# Patient Record
Sex: Male | Born: 1980 | Race: Black or African American | Hispanic: No | Marital: Single | Smoking: Current every day smoker
Health system: Southern US, Community
[De-identification: ages and names within clinical notes are randomized; demographics above are authoritative.]

## PROBLEM LIST (undated history)

## (undated) DIAGNOSIS — E119 Type 2 diabetes mellitus without complications: Secondary | ICD-10-CM

## (undated) HISTORY — PX: WISDOM TOOTH EXTRACTION: SHX21

---

## 2013-05-11 ENCOUNTER — Other Ambulatory Visit: Payer: Self-pay | Admitting: Neurosurgery

## 2013-05-11 DIAGNOSIS — M79604 Pain in right leg: Secondary | ICD-10-CM

## 2013-05-20 ENCOUNTER — Other Ambulatory Visit: Payer: Self-pay

## 2013-06-03 ENCOUNTER — Other Ambulatory Visit: Payer: Self-pay

## 2013-07-05 ENCOUNTER — Ambulatory Visit
Admission: RE | Admit: 2013-07-05 | Discharge: 2013-07-05 | Disposition: A | Discharge status: Home / Self Care | Payer: BC Managed Care – PPO | Source: Ambulatory Visit | Attending: Neurosurgery | Admitting: Neurosurgery

## 2013-07-05 DIAGNOSIS — M79604 Pain in right leg: Secondary | ICD-10-CM

## 2013-07-18 ENCOUNTER — Other Ambulatory Visit: Payer: Self-pay | Admitting: Neurosurgery

## 2013-07-18 DIAGNOSIS — M549 Dorsalgia, unspecified: Secondary | ICD-10-CM

## 2013-07-19 ENCOUNTER — Ambulatory Visit
Admission: RE | Admit: 2013-07-19 | Discharge: 2013-07-19 | Disposition: A | Discharge status: Home / Self Care | Payer: BC Managed Care – PPO | Source: Ambulatory Visit | Attending: Neurosurgery | Admitting: Neurosurgery

## 2013-07-19 VITALS — BP 144/85 | HR 90

## 2013-07-19 DIAGNOSIS — M549 Dorsalgia, unspecified: Secondary | ICD-10-CM

## 2013-07-19 DIAGNOSIS — M5126 Other intervertebral disc displacement, lumbar region: Secondary | ICD-10-CM

## 2013-07-19 MED ORDER — METHYLPREDNISOLONE ACETATE 40 MG/ML INJ SUSP (RADIOLOG
120.0000 mg | Freq: Once | INTRAMUSCULAR | Status: AC
  Administered 2013-07-19: 120 mg via EPIDURAL

## 2013-07-19 MED ORDER — IOHEXOL 180 MG/ML  SOLN
1.0000 mL | Freq: Once | INTRAMUSCULAR | Status: AC | PRN
Start: 2013-07-19 — End: 2013-07-19
  Administered 2013-07-19: 1 mL via EPIDURAL

## 2013-07-19 NOTE — Discharge Instructions (Signed)

## 2017-11-13 ENCOUNTER — Other Ambulatory Visit: Payer: Self-pay

## 2017-11-13 ENCOUNTER — Emergency Department (HOSPITAL_COMMUNITY)
Admission: EM | Admit: 2017-11-13 | Discharge: 2017-11-13 | Disposition: A | Discharge status: Home / Self Care | Payer: Self-pay | Attending: Emergency Medicine | Admitting: Emergency Medicine

## 2017-11-13 ENCOUNTER — Encounter (HOSPITAL_COMMUNITY): Payer: Self-pay | Admitting: Emergency Medicine

## 2017-11-13 DIAGNOSIS — F17210 Nicotine dependence, cigarettes, uncomplicated: Secondary | ICD-10-CM | POA: Insufficient documentation

## 2017-11-13 DIAGNOSIS — L0201 Cutaneous abscess of face: Secondary | ICD-10-CM | POA: Insufficient documentation

## 2017-11-13 DIAGNOSIS — E119 Type 2 diabetes mellitus without complications: Secondary | ICD-10-CM | POA: Insufficient documentation

## 2017-11-13 HISTORY — DX: Type 2 diabetes mellitus without complications: E11.9

## 2017-11-13 MED ORDER — LIDOCAINE-EPINEPHRINE (PF) 2 %-1:200000 IJ SOLN
10.0000 mL | Freq: Once | INTRAMUSCULAR | Status: AC
  Administered 2017-11-13: 10 mL via INTRADERMAL
  Filled 2017-11-13: qty 20

## 2017-11-13 MED ORDER — SULFAMETHOXAZOLE-TRIMETHOPRIM 800-160 MG PO TABS: 1.0000 | ORAL_TABLET | Freq: Two times a day (BID) | ORAL | 0 refills | Status: AC

## 2017-11-13 MED ORDER — POVIDONE-IODINE 10 % EX SOLN
CUTANEOUS | Status: AC
  Filled 2017-11-13: qty 15

## 2017-11-13 NOTE — ED Triage Notes (Signed)
Pt c/o of right sided facial swelling x 2 weeks. Pt denies any dental issues or new injuries.

## 2017-11-13 NOTE — ED Notes (Signed)
Pt has had a rasied area to his R jaw for 3 weeks Was seen at the [facility] 2 weeks ago, given an injection and told to see his PCP Pt saw provider who "looked at it and said it looked like a hair ball"   Pt reports that for the last 2 days, the area has been painful   Area to R lower jaw area is raised, not hot to touch, but tender to patient upon palpation

## 2017-11-13 NOTE — ED Provider Notes (Signed)
[facility] EMERGENCY DEPARTMENT Provider Note   CSN: [id number] Arrival date & time: 11/13/17  1132     History   Chief Complaint Chief Complaint  Patient presents with  . Facial Swelling    HPI Michael Mcgrath is a 37 y.o. male.  HPI  Michael Mcgrath is a 37 y.o. male who presents to the Emergency Department complaining of pain and swelling to the right lower jawline.  Symptoms present for 2 weeks.  He notes increased swelling after trimming his beard.  He describes the area as itching, but painful to the touch.  He denies fever, neck pain, dental pain and sore throat.  No trouble swallowing or opening and closing his mouth.  No history of rash or tick bite.  He states he was seen at another ER 2 weeks ago at time of onset and given some type of injection which he states provided no relief.  He is also been seen by his PCP for this.   Past Medical History:  Diagnosis Date  . Diabetes mellitus without complication (HCC)     There are no active problems to display for this patient.   Past Surgical History:  Procedure Laterality Date  . WISDOM TOOTH EXTRACTION        Home Medications    Prior to Admission medications   Not on File    Family History History reviewed. No pertinent family history.  Social History Social History   Tobacco Use  . Smoking status: Current Every Day Smoker    Packs/day: 0.50    Years: 8.00    Pack years: 4.00    Types: Cigarettes  . Smokeless tobacco: Never Used  Substance Use Topics  . Alcohol use: Yes  . Drug use: Never     Allergies   Hydrocodone   Review of Systems Review of Systems  Constitutional: Negative for chills and fever.  HENT: Positive for facial swelling. Negative for congestion, dental problem, sore throat and trouble swallowing.        Local area of pain and swelling along the right jawline  Gastrointestinal: Negative for nausea and vomiting.  Musculoskeletal: Negative for arthralgias, joint  swelling, neck pain and neck stiffness.  Skin: Negative for color change.       Abscess   Neurological: Negative for dizziness and headaches.  Hematological: Negative for adenopathy.  All other systems reviewed and are negative.    Physical Exam Updated Vital Signs BP (!) 161/92 (BP Location: Right Arm)   Pulse 77   Temp 97.9 F (36.6 C) (Oral)   Resp 20   Ht 5\' 7"  (1.702 m)   Wt 107 kg (236 lb)   SpO2 99%   BMI 36.96 kg/m   Physical Exam  Constitutional: He is oriented to person, place, and time. He appears well-developed and well-nourished. No distress.  HENT:  Head: Atraumatic.  Mouth/Throat: Oropharynx is clear and moist.  Quarter sized area of fluctuance and induration along the right mid jawline.  No surrounding erythema.  Patient does have a closely trimmed beard.  Neck: Normal range of motion. Neck supple.  Cardiovascular: Normal rate.  Pulmonary/Chest: Effort normal and breath sounds normal. No respiratory distress.  Musculoskeletal: Normal range of motion.  Lymphadenopathy:    He has no cervical adenopathy.  Neurological: He is alert and oriented to person, place, and time.  Skin: Skin is warm. Capillary refill takes less than 2 seconds.  Nursing note and vitals reviewed.    ED Treatments / Results  Labs (all labs ordered are listed, but only abnormal results are displayed) Labs Reviewed - No data to display  EKG None  Radiology No results found.  Procedures .Marland Kitchen.Incision and Drainage Date/Time: 11/13/2017 12:50 PM Performed by: Pauline Ausriplett, Deb Loudin, PA-C Authorized by: Pauline Ausriplett, Tylie Golonka, PA-C   Consent:    Consent obtained:  Verbal   Consent given by:  Patient   Risks discussed:  Bleeding, incomplete drainage and infection   Alternatives discussed:  No treatment Location:    Type:  Abscess   Size:  4 cm   Location:  Head   Head location:  Face Pre-procedure details:    Skin preparation:  Betadine Sedation:    Sedation type: None. Anesthesia (see  MAR for exact dosages):    Anesthesia method:  Local infiltration   Local anesthetic:  Lidocaine 2% WITH epi Procedure type:    Complexity:  Complex Procedure details:    Needle aspiration: yes     Needle size:  18 G   Incision types:  Single straight   Incision depth:  Dermal   Scalpel blade:  11   Wound management:  Probed and deloculated, irrigated with saline and extensive cleaning   Drainage:  Bloody and purulent   Drainage amount:  Moderate   Wound treatment:  Wound left open   Packing materials:  None Post-procedure details:    Patient tolerance of procedure:  Tolerated well, no immediate complications   (including critical care time)  .  Medications Ordered in ED Medications  povidone-iodine (BETADINE) 10 % external solution (  Handoff 11/13/17 1231)  lidocaine-EPINEPHrine (XYLOCAINE W/EPI) 2 %-1:200000 (PF) injection 10 mL (10 mLs Intradermal Given by Other 11/13/17 1231)     Initial Impression / Assessment and Plan / ED Course  I have reviewed the triage vital signs and the nursing notes.  Pertinent labs & imaging results that were available during my care of the patient were reviewed by me and considered in my medical decision making (see chart for details).     Patient well-appearing.  Vitals reviewed.  Abscess to the right lower face with successful I&D.  No surrounding cellulitis.  Patient agrees to treatment plan with warm compresses, antibiotics, and close follow-up with PCP.  Return precautions discussed.  Final Clinical Impressions(s) / ED Diagnoses   Final diagnoses:  Abscess of face    ED Discharge Orders    None       Rosey Bathriplett, Jennifer Payes, PA-C 11/15/17 2105    Terrilee FilesButler, Michael C, MD 11/16/17 1200

## 2017-11-13 NOTE — ED Notes (Signed)
TT in to I&D

## 2017-11-13 NOTE — Discharge Instructions (Addendum)
Apply warm wet compresses on and off to your face 2-3 times a day until the area heals.  Take the antibiotics as directed until finished.  Return to the ER for any worsening symptoms.

## 2017-11-13 NOTE — ED Notes (Signed)
TT in to repair

## 2018-02-11 ENCOUNTER — Other Ambulatory Visit: Payer: Self-pay

## 2018-02-11 ENCOUNTER — Emergency Department (HOSPITAL_COMMUNITY): Payer: Self-pay

## 2018-02-11 ENCOUNTER — Emergency Department (HOSPITAL_COMMUNITY)
Admission: EM | Admit: 2018-02-11 | Discharge: 2018-02-12 | Disposition: A | Discharge status: Home / Self Care | Payer: Self-pay | Attending: Emergency Medicine | Admitting: Emergency Medicine

## 2018-02-11 ENCOUNTER — Encounter (HOSPITAL_COMMUNITY): Payer: Self-pay | Admitting: Emergency Medicine

## 2018-02-11 DIAGNOSIS — Z7984 Long term (current) use of oral hypoglycemic drugs: Secondary | ICD-10-CM | POA: Insufficient documentation

## 2018-02-11 DIAGNOSIS — E119 Type 2 diabetes mellitus without complications: Secondary | ICD-10-CM | POA: Insufficient documentation

## 2018-02-11 DIAGNOSIS — F8081 Childhood onset fluency disorder: Secondary | ICD-10-CM | POA: Insufficient documentation

## 2018-02-11 DIAGNOSIS — R51 Headache: Secondary | ICD-10-CM | POA: Insufficient documentation

## 2018-02-11 DIAGNOSIS — I10 Essential (primary) hypertension: Secondary | ICD-10-CM | POA: Insufficient documentation

## 2018-02-11 DIAGNOSIS — F17210 Nicotine dependence, cigarettes, uncomplicated: Secondary | ICD-10-CM | POA: Insufficient documentation

## 2018-02-11 DIAGNOSIS — R519 Headache, unspecified: Secondary | ICD-10-CM

## 2018-02-11 LAB — BASIC METABOLIC PANEL
Anion gap: 9 (ref 5–15)
BUN: 9 mg/dL (ref 6–20)
CO2: 27 mmol/L (ref 22–32)
Calcium: 9.8 mg/dL (ref 8.9–10.3)
Chloride: 101 mmol/L (ref 98–111)
Creatinine, Ser: 0.78 mg/dL (ref 0.61–1.24)
GFR calc Af Amer: >60 mL/min (ref >60)
GFR calc non Af Amer: >60 mL/min (ref >60)
Glucose, Bld: 212 mg/dL — ABNORMAL HIGH (ref 70–99)
Potassium: 3.5 mmol/L (ref 3.5–5.1)
Sodium: 137 mmol/L (ref 135–145)

## 2018-02-11 LAB — CBC WITH DIFFERENTIAL/PLATELET
Abs Immature Granulocytes: 0.03 10*3/uL (ref 0.00–0.07)
Basophils Absolute: 0 10*3/uL (ref 0.0–0.1)
Basophils Relative: 0 %
Eosinophils Absolute: 0.1 10*3/uL (ref 0.0–0.5)
Eosinophils Relative: 1 %
HCT: 49.1 % (ref 39.0–52.0)
Hemoglobin: 15.6 g/dL (ref 13.0–17.0)
Immature Granulocytes: 0 %
Lymphocytes Relative: 28 %
Lymphs Abs: 3.5 10*3/uL (ref 0.7–4.0)
MCH: 24.8 pg — ABNORMAL LOW (ref 26.0–34.0)
MCHC: 31.8 g/dL (ref 30.0–36.0)
MCV: 77.9 fL — ABNORMAL LOW (ref 80.0–100.0)
Monocytes Absolute: 0.7 10*3/uL (ref 0.1–1.0)
Monocytes Relative: 6 %
Neutro Abs: 8 10*3/uL — ABNORMAL HIGH (ref 1.7–7.7)
Neutrophils Relative %: 65 %
Platelets: 304 10*3/uL (ref 150–400)
RBC: 6.3 MIL/uL — ABNORMAL HIGH (ref 4.22–5.81)
RDW: 15.9 % — ABNORMAL HIGH (ref 11.5–15.5)
WBC: 12.3 10*3/uL — ABNORMAL HIGH (ref 4.0–10.5)
nRBC: 0 % (ref 0.0–0.2)

## 2018-02-11 LAB — URINALYSIS, ROUTINE W REFLEX MICROSCOPIC
Bilirubin Urine: NEGATIVE
Glucose, UA: 150 mg/dL — AB
Hgb urine dipstick: NEGATIVE
Ketones, ur: NEGATIVE mg/dL
Leukocytes, UA: NEGATIVE
Nitrite: NEGATIVE
Protein, ur: NEGATIVE mg/dL
Specific Gravity, Urine: 1.024 (ref 1.005–1.030)
pH: 5 (ref 5.0–8.0)

## 2018-02-11 LAB — CBG MONITORING, ED: GLUCOSE-CAPILLARY: 205 mg/dL — AB (ref 70–99)

## 2018-02-11 LAB — RAPID URINE DRUG SCREEN, HOSP PERFORMED
Amphetamines: NOT DETECTED
Barbiturates: NOT DETECTED
Benzodiazepines: NOT DETECTED
Cocaine: NOT DETECTED
Opiates: NOT DETECTED
Tetrahydrocannabinol: NOT DETECTED

## 2018-02-11 NOTE — ED Triage Notes (Signed)
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 Pt started having stuttering, shaking, carpal tunnel flare up and pain to rt side of head. 6/10 pain. Pt reports taking a friend's insulin today because he ran out of his. No injuries. A&Ox4, pt able to ambulate. Hx of diabetes. BP high in triage. CBG 205.

## 2018-02-11 NOTE — ED Notes (Signed)
Results reviewed, no changes in acuity at this time 

## 2018-02-11 NOTE — ED Provider Notes (Signed)
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 Pain to left arm, resolved now. Right temple painful, sharp and does not radiate.Felt palpitations. States he felt panicky.  Denies vision changes, vomiting, SOB. Checked his BS which was 200.  States that his only changes that he ran out of his insulin and used a friend's insulin today for the first time which he has never taken before.  States he has a history of hypertension but has not been on blood pressure medicines for several years because he did not need to be.  ROS: +headache, palpitations, stuttering  Physical Exam:   Gen: No distress  Neuro: Awake and Alert  Skin: Warm    Focused Exam: Heart regular rate and rhythm, no murmurs rubs or gallops.  Lungs clear to auscultation bilaterally.  He is stuttering and intermittently tremulous.  Ambulates with an ataxic gait.  Romberg sign absent.  No pronator drift.  Sensation intact to soft touch of face and extremities.  5/5 strength of BUE and BLE major muscle groups.  Cranial nerves II through XII tested and intact including peripheral fields.  Tenderness to palpation at the right TMJ.   Initiation of care has begun. The patient has been counseled on the process, plan, and necessity for staying for the completion/evaluation, and the remainder of the medical screening examination    Bennye Alm 02/11/18 2039    Dione Booze, MD 02/12/18 [phone]

## 2018-02-12 MED ORDER — DIPHENHYDRAMINE HCL 50 MG/ML IJ SOLN
25.0000 mg | Freq: Once | INTRAMUSCULAR | Status: AC
  Administered 2018-02-12: 25 mg via INTRAVENOUS
  Filled 2018-02-12: qty 1

## 2018-02-12 MED ORDER — METOCLOPRAMIDE HCL 5 MG/ML IJ SOLN
10.0000 mg | Freq: Once | INTRAMUSCULAR | Status: AC
  Administered 2018-02-12: 10 mg via INTRAVENOUS
  Filled 2018-02-12: qty 2

## 2018-02-12 MED ORDER — SODIUM CHLORIDE 0.9 % IV BOLUS
1000.0000 mL | Freq: Once | INTRAVENOUS | Status: AC
  Administered 2018-02-12: 1000 mL via INTRAVENOUS

## 2018-02-12 NOTE — Discharge Instructions (Addendum)
Call your doctor today to get a new prescription for your insulin.

## 2018-02-12 NOTE — ED Notes (Signed)
Pt discharged from ED; instructions provided; Pt encouraged to return to ED if symptoms worsen and to f/u with PCP; Pt verbalized understanding of all instructions 

## 2018-02-12 NOTE — ED Provider Notes (Signed)
[facility] EMERGENCY DEPARTMENT Provider Note   CSN: [id number] Arrival date & time: 02/11/18  2011     History   Chief Complaint Chief Complaint  Patient presents with  . Stuttering  . Feeling bad  . Headache    HPI Michael Mcgrath is a 37 y.o. male.  The history is provided by the patient.  He has history of diabetes and comes in because of episode of stuttering and feeling shaky.  He had apparently run out of his insulin and been borrowing some from a friend.  Blood sugar at home yesterday and today had been high.  At work, he developed a right temporal headache and this was followed by generalized shaking and stuttering.  He was off balance.  The stuttering and shaking lasted about 2 hours before resolving, headache has persisted.  Headache was originally 8/10, but has diminished to 6/10.  There has been nausea but no vomiting.  He denies any visual change.  He denies photophobia or phonophobia.  Past Medical History:  Diagnosis Date  . Diabetes mellitus without complication (HCC)     There are no active problems to display for this patient.   Past Surgical History:  Procedure Laterality Date  . WISDOM TOOTH EXTRACTION          Home Medications    Prior to Admission medications   Not on File    Family History No family history on file.  Social History Social History   Tobacco Use  . Smoking status: Current Every Day Smoker    Packs/day: 0.50    Years: 8.00    Pack years: 4.00    Types: Cigarettes  . Smokeless tobacco: Never Used  Substance Use Topics  . Alcohol use: Yes  . Drug use: Never     Allergies   Hydrocodone   Review of Systems Review of Systems  All other systems reviewed and are negative.    Physical Exam Updated Vital Signs BP (!) 158/99 (BP Location: Right Arm)   Pulse 88   Temp 98.6 F (37 C) (Oral)   Resp 14   Ht 5\' 6"  (1.676 m)   Wt 111.6 kg   SpO2 96%   BMI 39.71 kg/m   Physical Exam    Nursing note and vitals reviewed.  37 year old male, resting comfortably and in no acute distress. Vital signs are significant for elevated blood pressure. Oxygen saturation is 96%, which is normal. Head is normocephalic and atraumatic. PERRLA, EOMI. Oropharynx is clear.  There is mild tenderness to palpation over the right temporalis muscle.  There is no tenderness palpation at the insertion of paracervical muscles. Neck is nontender and supple without adenopathy or JVD. Back is nontender and there is no CVA tenderness. Lungs are clear without rales, wheezes, or rhonchi. Chest is nontender. Heart has regular rate and rhythm without murmur. Abdomen is soft, flat, nontender without masses or hepatosplenomegaly and peristalsis is normoactive. Extremities have no cyanosis or edema, full range of motion is present. Skin is warm and dry without rash. Neurologic: Mental status is normal, cranial nerves are intact, there are no motor or sensory deficits.  There is no pronator drift.  Speech is spontaneous and appropriate without evidence of stuttering.  ED Treatments / Results  Labs (all labs ordered are listed, but only abnormal results are displayed) Labs Reviewed  CBC WITH DIFFERENTIAL/PLATELET - Abnormal; Notable for the following components:      Result Value   WBC 12.3 (*)  RBC 6.30 (*)    MCV 77.9 (*)    MCH 24.8 (*)    RDW 15.9 (*)    Neutro Abs 8.0 (*)    All other components within normal limits  BASIC METABOLIC PANEL - Abnormal; Notable for the following components:   Glucose, Bld 212 (*)    All other components within normal limits  URINALYSIS, ROUTINE W REFLEX MICROSCOPIC - Abnormal; Notable for the following components:   Color, Urine AMBER (*)    APPearance CLOUDY (*)    Glucose, UA 150 (*)    All other components within normal limits  CBG MONITORING, ED - Abnormal; Notable for the following components:   Glucose-Capillary 205 (*)    All other components within normal  limits  RAPID URINE DRUG SCREEN, HOSP PERFORMED    EKG EKG Interpretation  Date/Time:  Friday February 11 2018 20:53:46 EDT Ventricular Rate:  85 PR Interval:  152 QRS Duration: 100 QT Interval:  364 QTC Calculation: 433 R Axis:   93 Text Interpretation:  Normal sinus rhythm Possible Right ventricular hypertrophy Abnormal ECG No old tracing to compare Confirmed by Dione Booze ([id number]) on 02/11/2018 11:05:42 PM   Radiology Ct Head Wo Contrast  Result Date: 02/11/2018 CLINICAL DATA:  Sudden onset of speech disturbance. EXAM: CT HEAD WITHOUT CONTRAST TECHNIQUE: Contiguous axial images were obtained from the base of the skull through the vertex without intravenous contrast. COMPARISON:  None. FINDINGS: Brain: The brain shows a normal appearance without evidence of malformation, atrophy, old or acute small or large vessel infarction, mass lesion, hemorrhage, hydrocephalus or extra-axial collection. Vascular: No hyperdense vessel. No evidence of atherosclerotic calcification. Skull: Normal.  No traumatic finding.  No focal bone lesion. Sinuses/Orbits: Sinuses are clear. Orbits appear normal. Mastoids are clear. Other: None significant IMPRESSION: Normal head CT Electronically Signed   By: Paulina Fusi M.D.   On: 02/11/2018 21:37    Procedures Procedures  Medications Ordered in ED Medications  sodium chloride 0.9 % bolus 1,000 mL (1,000 mLs Intravenous New Bag/Given 02/12/18 0138)  metoCLOPramide (REGLAN) injection 10 mg (10 mg Intravenous Given 02/12/18 0139)  diphenhydrAMINE (BENADRYL) injection 25 mg (25 mg Intravenous Given 02/12/18 0139)     Initial Impression / Assessment and Plan / ED Course  I have reviewed the triage vital signs and the nursing notes.  Pertinent labs & imaging results that were available during my care of the patient were reviewed by me and considered in my medical decision making (see chart for details).  Episode of stuttering and body shaking of uncertain  cause.  Symptoms occurred following headache, suspect possible migraine variant.  In spite of history, glucose was only mildly elevated in the emergency department.  Remainder of screening labs were normal.  Urinalysis was significant for presence of glucose, drug screen negative.  CT of head showed no acute process.  Old records are reviewed, and he has no relevant past visits.  He will be given a migraine cocktail of normal saline, metoclopramide, diphenhydramine and he will be reassessed at that point.  He had excellent relief of his headache from above-noted treatment.  At this point, I feel that his symptoms were all secondary to his headache which appears to be a migraine variant.  I have talked with him about his insulin, but he is not certain what type of insulin he is on, so I do not feel comfortable writing a prescription for it.  He is to contact his endocrinologist today to get new prescription  for his insulin.  Final Clinical Impressions(s) / ED Diagnoses   Final diagnoses:  Bad headache  Stuttering    ED Discharge Orders    None       Dione Booze, MD 02/12/18 [phone]

## 2018-02-14 ENCOUNTER — Emergency Department (HOSPITAL_COMMUNITY): Payer: Self-pay

## 2018-02-14 ENCOUNTER — Encounter (HOSPITAL_COMMUNITY): Payer: Self-pay | Admitting: *Deleted

## 2018-02-14 ENCOUNTER — Other Ambulatory Visit: Payer: Self-pay

## 2018-02-14 ENCOUNTER — Emergency Department (HOSPITAL_COMMUNITY)
Admission: EM | Admit: 2018-02-14 | Discharge: 2018-02-14 | Disposition: A | Discharge status: Home / Self Care | Payer: Self-pay | Attending: Emergency Medicine | Admitting: Emergency Medicine

## 2018-02-14 DIAGNOSIS — Y92410 Unspecified street and highway as the place of occurrence of the external cause: Secondary | ICD-10-CM | POA: Insufficient documentation

## 2018-02-14 DIAGNOSIS — Y9389 Activity, other specified: Secondary | ICD-10-CM | POA: Insufficient documentation

## 2018-02-14 DIAGNOSIS — F17210 Nicotine dependence, cigarettes, uncomplicated: Secondary | ICD-10-CM | POA: Insufficient documentation

## 2018-02-14 DIAGNOSIS — Z794 Long term (current) use of insulin: Secondary | ICD-10-CM | POA: Insufficient documentation

## 2018-02-14 DIAGNOSIS — R51 Headache: Secondary | ICD-10-CM | POA: Insufficient documentation

## 2018-02-14 DIAGNOSIS — E119 Type 2 diabetes mellitus without complications: Secondary | ICD-10-CM | POA: Insufficient documentation

## 2018-02-14 DIAGNOSIS — Y999 Unspecified external cause status: Secondary | ICD-10-CM | POA: Insufficient documentation

## 2018-02-14 DIAGNOSIS — S46812A Strain of other muscles, fascia and tendons at shoulder and upper arm level, left arm, initial encounter: Secondary | ICD-10-CM | POA: Insufficient documentation

## 2018-02-14 DIAGNOSIS — S8002XA Contusion of left knee, initial encounter: Secondary | ICD-10-CM | POA: Insufficient documentation

## 2018-02-14 MED ORDER — NAPROXEN 500 MG PO TABS: ORAL_TABLET | ORAL | 0 refills | Status: DC

## 2018-02-14 MED ORDER — NAPROXEN 250 MG PO TABS
500.0000 mg | ORAL_TABLET | Freq: Once | ORAL | Status: AC
  Administered 2018-02-14: 500 mg via ORAL
  Filled 2018-02-14: qty 2

## 2018-02-14 MED ORDER — CYCLOBENZAPRINE HCL 10 MG PO TABS
5.0000 mg | ORAL_TABLET | Freq: Once | ORAL | Status: AC
  Administered 2018-02-14: 5 mg via ORAL
  Filled 2018-02-14: qty 1

## 2018-02-14 MED ORDER — METHOCARBAMOL 500 MG PO TABS: ORAL_TABLET | ORAL | 0 refills | Status: DC
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
## 2018-02-14 NOTE — Discharge Instructions (Addendum)
Ice packs to the injured or sore muscles for the next several days then start using heat. Take the medications for pain and muscle spasms. Return to the ED for any problems listed on the head injury sheet. Recheck if you aren't improving in the next week.  You can have your knee rechecked by an orthopedist of your choice or you can call Dr. Mort Sawyers office for a recheck appointment.  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

## 2018-02-14 NOTE — ED Notes (Signed)
Patient returned to Xray.

## 2018-02-14 NOTE — ED Notes (Signed)
ED Provider at bedside. 

## 2019-08-21 ENCOUNTER — Telehealth: Payer: Self-pay

## 2019-08-21 ENCOUNTER — Other Ambulatory Visit: Payer: Self-pay

## 2019-08-21 ENCOUNTER — Other Ambulatory Visit: Payer: Self-pay | Admitting: Physician Assistant

## 2019-08-21 ENCOUNTER — Ambulatory Visit: Payer: Self-pay | Admitting: Physician Assistant

## 2019-08-21 VITALS — BP 141/89 | HR 80 | Temp 98.7°F | Resp 18 | Ht 67.0 in | Wt 242.0 lb

## 2019-08-21 DIAGNOSIS — F17200 Nicotine dependence, unspecified, uncomplicated: Secondary | ICD-10-CM

## 2019-08-21 DIAGNOSIS — R030 Elevated blood-pressure reading, without diagnosis of hypertension: Secondary | ICD-10-CM

## 2019-08-21 DIAGNOSIS — M25512 Pain in left shoulder: Secondary | ICD-10-CM

## 2019-08-21 DIAGNOSIS — Z598 Other problems related to housing and economic circumstances: Secondary | ICD-10-CM

## 2019-08-21 DIAGNOSIS — Z13220 Encounter for screening for lipoid disorders: Secondary | ICD-10-CM

## 2019-08-21 DIAGNOSIS — R739 Hyperglycemia, unspecified: Secondary | ICD-10-CM

## 2019-08-21 DIAGNOSIS — Z5989 Other problems related to housing and economic circumstances: Secondary | ICD-10-CM

## 2019-08-21 DIAGNOSIS — E1165 Type 2 diabetes mellitus with hyperglycemia: Secondary | ICD-10-CM

## 2019-08-21 DIAGNOSIS — M5430 Sciatica, unspecified side: Secondary | ICD-10-CM | POA: Insufficient documentation

## 2019-08-21 LAB — POCT GLYCOSYLATED HEMOGLOBIN (HGB A1C): Hemoglobin A1C: 10.7 % — AB (ref 4.0–5.6)

## 2019-08-21 LAB — GLUCOSE, POCT (MANUAL RESULT ENTRY): POC Glucose: 292 mg/dl — AB (ref 70–99)

## 2019-08-21 MED ORDER — ACCU-CHEK AVIVA PLUS VI STRP: ORAL_STRIP | 12 refills | Status: DC

## 2019-08-21 MED ORDER — IBUPROFEN 600 MG PO TABS: 600.0000 mg | ORAL_TABLET | Freq: Three times a day (TID) | ORAL | 0 refills | Status: AC | PRN

## 2019-08-21 MED ORDER — CYCLOBENZAPRINE HCL 10 MG PO TABS: 10.0000 mg | ORAL_TABLET | Freq: Three times a day (TID) | ORAL | 0 refills | Status: AC | PRN

## 2019-08-21 MED ORDER — TRUEPLUS 5-BEVEL PEN NEEDLES 32G X 4 MM MISC: 11 refills | Status: AC

## 2019-08-21 MED ORDER — TRUE METRIX METER DEVI: 1.0000 [IU] | Freq: Four times a day (QID) | 0 refills | Status: AC

## 2019-08-21 MED ORDER — "INSULIN SYRINGE 30G X 1/2"" 0.5 ML MISC": 1.0000 | Freq: Three times a day (TID) | 2 refills | Status: AC

## 2019-08-21 MED ORDER — INSULIN ASPART 100 UNIT/ML ~~LOC~~ SOLN: 10.0000 [IU] | Freq: Three times a day (TID) | SUBCUTANEOUS | 2 refills | Status: AC

## 2019-08-21 MED ORDER — LISINOPRIL 5 MG PO TABS: 5.0000 mg | ORAL_TABLET | Freq: Every day | ORAL | 3 refills | Status: AC

## 2019-08-21 MED ORDER — INSULIN DETEMIR 100 UNIT/ML ~~LOC~~ SOLN: 10.0000 [IU] | Freq: Every day | SUBCUTANEOUS | 2 refills | Status: AC

## 2019-08-21 MED ORDER — INSULIN LISPRO (1 UNIT DIAL) 100 UNIT/ML (KWIKPEN): 10.0000 [IU] | PEN_INJECTOR | Freq: Three times a day (TID) | SUBCUTANEOUS | 2 refills | Status: AC

## 2019-08-21 MED ORDER — TRUE METRIX PRO BLOOD GLUCOSE VI STRP: ORAL_STRIP | 12 refills | Status: AC

## 2019-08-21 MED ORDER — INSULIN ASPART 100 UNIT/ML ~~LOC~~ SOLN
8.0000 [IU] | Freq: Once | SUBCUTANEOUS | Status: AC
  Administered 2019-08-21: 8 [IU] via SUBCUTANEOUS

## 2019-08-21 MED ORDER — ACCU-CHEK SOFTCLIX LANCETS MISC: 12 refills | Status: DC

## 2019-08-21 MED ORDER — ACCU-CHEK SOFTCLIX LANCETS MISC: 12 refills | Status: AC

## 2019-08-21 MED FILL — TRUEplus LANCETS 28G MISC: 25 days supply | Qty: 100 | Fill #0

## 2019-08-21 MED FILL — TRUE METRIX TEST STRIP: 25 days supply | Qty: 100 | Fill #0

## 2019-08-21 MED FILL — IBUPROFEN 600 MG TABLET: 600 | 10 days supply | Qty: 30 | Fill #0

## 2019-08-21 MED FILL — !LEVEMIR FLEXTOUCH 100 UNIT: 100 | 30 days supply | Qty: 3 | Fill #0

## 2019-08-21 MED FILL — LISINOPRIL 5 MG TABLET: 5 | 30 days supply | Qty: 30 | Fill #0

## 2019-08-21 MED FILL — CYCLOBENZAPRINE 10 MG TAB: 10 | 10 days supply | Qty: 30 | Fill #0

## 2019-08-21 MED FILL — !TRUE METRIX BLOOD GLUCOSE: 365 days supply | Qty: 1 | Fill #0

## 2019-08-21 MED FILL — TRUEPLUS PEN NDL 32GX5/32: 32G X 4 MM | 25 days supply | Qty: 100 | Fill #0

## 2019-08-21 MED FILL — !HUMALOG 100 UNITS/ML KWIKP: 100 | 30 days supply | Qty: 9 | Fill #0

## 2019-08-21 NOTE — Progress Notes (Signed)
New Patient Office Visit  Subjective:  Patient ID: Michael Mcgrath, male    DOB: 12/18/1980  Age: 39 y.o. MRN: [id number]  CC:  Chief Complaint  Patient presents with  . Shoulder Pain    left    HPI Michael Mcgrath presents for medication refills, and left shoulder pain.  Reports that he has been out of his diabetic medication for the past 2 months.  Reports that he was last seen by his primary care provider in [place] in October.  Reports that he is going to start working in [place] tomorrow, will be living in this area.  Has not been checking his blood sugar, has not been following a diabetic diet.  States that he has previously failed Lantus and Metformin, states that he did not like the way he felt when he was taking the medication.  Reports that he was given samples of Xultophy, but is currently uninsured and unable to afford this medication.  Reports that he has had increased urination.  Reports that he started having left shoulder pain Thursday morning, denies injury trauma, denies numbness or tingling, denies radiation.  Has tried ibuprofen, icy hot, heating pad without much relief.       Past Medical History:  Diagnosis Date  . Diabetes mellitus without complication [facility])     Past Surgical History:  Procedure Laterality Date  . WISDOM TOOTH EXTRACTION      History reviewed. No pertinent family history.  Social History   Socioeconomic History  . Marital status: Single    Spouse name: Not on file  . Number of children: Not on file  . Years of education: Not on file  . Highest education level: Not on file  Occupational History  . Not on file  Tobacco Use  . Smoking status: Current Every Day Smoker    Packs/day: 0.50    Years: 8.00    Pack years: 4.00    Types: Cigarettes  . Smokeless tobacco: Never Used  Substance and Sexual Activity  . Alcohol use: Yes  . Drug use: Never  . Sexual activity: Yes  Other Topics Concern  . Not on file  Social  History Narrative  . Not on file   Social Determinants of Health   Financial Resource Strain:   . Difficulty of Paying Living Expenses:   Food Insecurity:   . Worried About Charity fundraiser in the Last Year:   . Arboriculturist in the Last Year:   Transportation Needs:   . Film/video editor (Medical):   Marland Kitchen Lack of Transportation (Non-Medical):   Physical Activity:   . Days of Exercise per Week:   . Minutes of Exercise per Session:   Stress:   . Feeling of Stress :   Social Connections:   . Frequency of Communication with Friends and Family:   . Frequency of Social Gatherings with Friends and Family:   . Attends Religious Services:   . Active Member of Clubs or Organizations:   . Attends Archivist Meetings:   Marland Kitchen Marital Status:   Intimate Partner Violence:   . Fear of Current or Ex-Partner:   . Emotionally Abused:   Marland Kitchen Physically Abused:   . Sexually Abused:     ROS Review of Systems  Constitutional: Negative.   HENT: Negative.   Eyes: Negative.  Negative for visual disturbance.  Respiratory: Negative.   Cardiovascular: Negative.   Gastrointestinal: Negative.   Endocrine: Positive for polyuria. Negative for polydipsia.  Musculoskeletal: Positive for arthralgias and myalgias.  Skin: Negative.   Allergic/Immunologic: Negative.   Neurological: Negative for dizziness, light-headedness and numbness.  Hematological: Negative.   Psychiatric/Behavioral: Negative.     Objective:   Today's Vitals: BP (!) 141/89 (BP Location: Left Arm, Patient Position: Sitting, Cuff Size: Large)   Pulse 80   Temp 98.7 F (37.1 C) (Oral)   Resp 18   Ht 5\' 7"  (1.702 m)   Wt 242 lb (109.8 kg)   SpO2 97%   BMI 37.90 kg/m   Physical Exam Vitals and nursing note reviewed.  Constitutional:      General: He is not in acute distress.    Appearance: Normal appearance. He is obese. He is not ill-appearing.  HENT:     Head: Normocephalic and atraumatic.     Right Ear:  External ear normal.     Left Ear: External ear normal.     Nose: Nose normal.     Mouth/Throat:     Mouth: Mucous membranes are moist.     Pharynx: Oropharynx is clear.  Eyes:     Extraocular Movements: Extraocular movements intact.     Conjunctiva/sclera: Conjunctivae normal.     Pupils: Pupils are equal, round, and reactive to light.  Cardiovascular:     Rate and Rhythm: Normal rate and regular rhythm.     Pulses: Normal pulses.     Heart sounds: Normal heart sounds.  Pulmonary:     Effort: Pulmonary effort is normal.     Breath sounds: Normal breath sounds.  Abdominal:     General: Abdomen is flat. Bowel sounds are normal.     Palpations: Abdomen is soft.  Musculoskeletal:     Right shoulder: Normal.     Left shoulder: Tenderness present. No swelling or crepitus. Decreased range of motion. Normal strength.     Cervical back: Normal range of motion and neck supple.  Skin:    General: Skin is warm and dry.  Neurological:     General: No focal deficit present.     Mental Status: He is alert and oriented to person, place, and time.  Psychiatric:        Mood and Affect: Mood normal.        Behavior: Behavior normal.        Thought Content: Thought content normal.        Judgment: Judgment normal.     Assessment & Plan:   Problem List Items Addressed This Visit    None    Visit Diagnoses    Uncontrolled type 2 diabetes mellitus with hyperglycemia (HCC)    -  Primary   Relevant Medications   insulin aspart (novoLOG) injection 8 Units (Completed)   glucose blood (ACCU-CHEK AVIVA PLUS) test strip   Accu-Chek Softclix Lancets lancets   lisinopril (ZESTRIL) 5 MG tablet   insulin detemir (LEVEMIR) 100 UNIT/ML injection   insulin aspart (NOVOLOG) 100 UNIT/ML injection   Insulin Syringe-Needle U-100 (INSULIN SYRINGE .5CC/30GX1/2") 30G X 1/2" 0.5 ML MISC   Other Relevant Orders   CBC with Differential/Platelet   Comp. Metabolic Panel (12)   Lipid panel   Microalbumin /  creatinine urine ratio   TSH   Vitamin D, 25-hydroxy   Glucose (CBG) (Completed)   HgB A1c (Completed)   Hyperglycemia       Relevant Medications   insulin aspart (novoLOG) injection 8 Units (Completed)   Acute pain of left shoulder       Relevant Medications  cyclobenzaprine (FLEXERIL) 10 MG tablet   ibuprofen (ADVIL) 600 MG tablet   Uninsured       Lipid screening       Relevant Orders   Lipid panel   Elevated blood pressure reading in office without diagnosis of hypertension       Morbid obesity (HCC)       Relevant Medications   insulin aspart (novoLOG) injection 8 Units (Completed)   insulin detemir (LEVEMIR) 100 UNIT/ML injection   insulin aspart (NOVOLOG) 100 UNIT/ML injection   Tobacco use disorder          Outpatient Encounter Medications as of 08/21/2019  Medication Sig  . Accu-Chek Softclix Lancets lancets Use as instructed  . cyclobenzaprine (FLEXERIL) 10 MG tablet Take 1 tablet (10 mg total) by mouth 3 (three) times daily as needed for muscle spasms.  Marland Kitchen glucose blood (ACCU-CHEK AVIVA PLUS) test strip Use as instructed  . ibuprofen (ADVIL) 600 MG tablet Take 1 tablet (600 mg total) by mouth every 8 (eight) hours as needed.  . insulin aspart (NOVOLOG) 100 UNIT/ML injection Inject 10 Units into the skin 3 (three) times daily before meals.  . insulin detemir (LEVEMIR) 100 UNIT/ML injection Inject 0.1 mLs (10 Units total) into the skin at bedtime.  . Insulin Syringe-Needle U-100 (INSULIN SYRINGE .5CC/30GX1/2") 30G X 1/2" 0.5 ML MISC 1 Syringe by Does not apply route in the morning, at noon, and at bedtime.  Marland Kitchen lisinopril (ZESTRIL) 5 MG tablet Take 1 tablet (5 mg total) by mouth daily.  . [DISCONTINUED] Insulin Glargine-Lixisenatide (SOLIQUA ) Inject 30 Units into the skin daily.  . [DISCONTINUED] metFORMIN (GLUCOPHAGE-XR) 500 MG 24 hr tablet Take 1,000 mg by mouth 2 (two) times daily.  . [DISCONTINUED] methocarbamol (ROBAXIN) 500 MG tablet Take 1 or 2 po Q 6hrs for  painful muscles (Patient not taking: Reported on 08/21/2019)  . [DISCONTINUED] naproxen (NAPROSYN) 500 MG tablet Take 1 po BID with food prn pain (Patient not taking: Reported on 08/21/2019)  . [EXPIRED] insulin aspart (novoLOG) injection 8 Units    No facility-administered encounter medications on file as of 08/21/2019.   PLAN:  1. Uncontrolled type 2 diabetes mellitus with hyperglycemia (HCC)  Patient is unable to tolerate Metformin or Lantus, is currently uninsured, after discussion of medication treatment regimens, patient would prefer to use sliding scale insulin 3 times a day prior to meals and Levemir at bedtime.  We will also start patient on lisinopril 5 mg, fasting labs completed today.  Patient is currently uninsured, was given information on [facility] financial assistance, [facility].   Issues reviewed with him: home glucose monitoring emphasized, all medications, side effects and compliance discussed carefully, use and side effects of insulin is taught, foot care discussed and Podiatry visits discussed, annual eye examinations at Ophthalmology discussed and long term diabetic complications discussed.  - CBC with Differential/Platelet - Comp. Metabolic Panel (12) - Lipid panel - Microalbumin / creatinine urine ratio - TSH - Vitamin D, 25-hydroxy - glucose blood (ACCU-CHEK AVIVA PLUS) test strip; Use as instructed  Dispense: 100 each; Refill: 12 - Accu-Chek Softclix Lancets lancets; Use as instructed  Dispense: 100 each; Refill: 12 - lisinopril (ZESTRIL) 5 MG tablet; Take 1 tablet (5 mg total) by mouth daily.  Dispense: 90 tablet; Refill: 3 - insulin detemir (LEVEMIR) 100 UNIT/ML injection; Inject 0.1 mLs (10 Units total) into the skin at bedtime.  Dispense: 3 mL;  Refill: 2 - insulin aspart (NOVOLOG) 100 UNIT/ML injection; Inject 10 Units into the skin 3 (three) times daily before meals.   Dispense: 9 mL; Refill: 2 - Insulin Syringe-Needle U-100 (INSULIN SYRINGE .5CC/30GX1/2") 30G X 1/2" 0.5 ML MISC; 1 Syringe by Does not apply route in the morning, at noon, and at bedtime.  Dispense: 90 each; Refill: 2 - Glucose (CBG) - HgB A1c  2. Hyperglycemia  - insulin aspart (novoLOG) injection 8 Units  3. Acute pain of left shoulder Patient education given on rest, ice, ibuprofen, over-the-counter pain relief cream, gentle stretching as tolerated - cyclobenzaprine (FLEXERIL) 10 MG tablet; Take 1 tablet (10 mg total) by mouth 3 (three) times daily as needed for muscle spasms.  Dispense: 30 tablet; Refill: 0 - ibuprofen (ADVIL) 600 MG tablet; Take 1 tablet (600 mg total) by mouth every 8 (eight) hours as needed.  Dispense: 30 tablet; Refill: 0  4. Uninsured   5. Lipid screening  - Lipid panel  6. Elevated blood pressure reading in office without diagnosis of hypertension   7. Morbid obesity (HCC)  8. Tobacco Use Disorder   I have reviewed the patient's medical history (PMH, PSH, Social History, Family History, Medications, and allergies) , and have been updated if relevant. I spent 40 minutes reviewing chart and  face to face time with patient.      . Follow-up: Return in about 29 days (around 09/19/2019) for To establish PCP at [facility].   Kasandra Knudsen Mayers, PA-C

## 2019-08-21 NOTE — Progress Notes (Signed)
Patient verified DOB Patient has not eaten today and has no medication.

## 2019-08-21 NOTE — Telephone Encounter (Signed)
Also, can we change the Novolog to Humalog?

## 2019-08-21 NOTE — Telephone Encounter (Signed)
  We received scripts for written drug=vials, but qty=pens.  I pens are preferred can we get a script for the pen needles, or would you like [place] to adjust the qty for vials?

## 2019-08-21 NOTE — Patient Instructions (Signed)
Your A1c was 10.7, this is considered uncontrolled diabetes, because you are unable to tolerate Metformin and Lantus, we will start using sliding scale insulin 3 times a day prior to meals.  At this time I recommend using 10 units 3 times a day, if you see that your blood sugars are becoming too low, adapt the sliding scale method as described in this handout.  You will also use Levemir, it is a long-acting insulin, you will use this at bedtime, starting at 10 units at this time.  I have also prescribed 5 mg of lisinopril, this is a low-dose of a blood pressure medication, you will take this to help protect your kidneys.  For your shoulder pain, I recommend rest, ice, gentle stretching, ice and ibuprofen and a muscle relaxer to the pharmacy.  You can also continue to use over-the-counter pain creams.  Please let us know if there is anything else we can do to help you, we will call to review your labs with you, and we have scheduled you a follow-up appointment to [facility].  Michael Rad, PA-C Phys[facility] [place] [email or web address]   Diabetic Sliding Scale (I)  IF BLOOD SUGAR IS:   LESS THAN 100 ( NO INSULIN)   100-140 (2 UNITS)   141-180 (4 UNITS)   181-220 (6 UNITS)   221-260 (8 UNITS)   261-300 (10 UNITS)   301-340 (12 UNITS)   MORE THAN 341 UNITS (14 UNITS)    Shoulder Pain Many things can cause shoulder pain, including:  An injury to the shoulder.  Overuse of the shoulder.  Arthritis. The source of the pain can be:  Inflammation.  An injury to the shoulder joint.  An injury to a tendon, ligament, or bone. Follow these instructions at home: Pay attention to changes in your symptoms. Let your health care provider know about them. Follow these instructions to relieve your pain. If you have a sling:  Wear the sling as told by your  health care provider. Remove it only as told by your health care provider.  Loosen the sling if your fingers tingle, become numb, or turn cold and blue.  Keep the sling clean.  If the sling is not waterproof: ? Do not let it get wet. Remove it to shower or bathe.  Move your arm as little as possible, but keep your hand moving to prevent swelling. Managing pain, stiffness, and swelling   If directed, put ice on the painful area: ? Put ice in a plastic bag. ? Place a towel between your skin and the bag. ? Leave the ice on for 20 minutes, 2-3 times per day. Stop applying ice if it does not help with the pain.  Squeeze a soft ball or a foam pad as much as possible. This helps to keep the shoulder from swelling. It also helps to strengthen the arm. General instructions  Take over-the-counter and prescription medicines only as told by your health care provider.  Keep all follow-up visits as told by your health care provider. This is important. Contact a health care provider if:  Your pain gets worse.  Your pain is not relieved with medicines.  New pain develops in your arm, hand, or fingers. Get help right away if:  Your arm, hand, or fingers: ? Tingle. ? Become numb. ? Become swollen. ? Become painful. ? Turn white or blue. Summary  Shoulder pain can be caused by an injury,  overuse, or arthritis.  Pay attention to changes in your symptoms. Let your health care provider know about them.  This condition may be treated with a sling, ice, and pain medicines.  Contact your health care provider if the pain gets worse or new pain develops. Get help right away if your arm, hand, or fingers tingle or become numb, swollen, or painful.  Keep all follow-up visits as told by your health care provider. This is important. This information is not intended to replace advice given to you by your health care provider. Make sure you discuss any questions you have with your health care  provider. Document Revised: 10/26/2017 Document Reviewed: 10/26/2017 Elsevier Patient Education  2020 [employer] Insulin Detemir injection What is this medicine? INSULIN DETEMIR (IN su lin DE te mir) is a human-made form of insulin. This drug lowers the amount of sugar in your blood. It is a long-acting insulin that is usually given once or twice a day. This medicine may be used for other purposes; ask your health care provider or pharmacist if you have questions. COMMON BRAND NAME(S): Levemir, Levemir FlexTouch What should I tell my health care provider before I take this medicine? They need to know if you have any of these conditions:  episodes of low blood sugar  eye disease, vision problems  kidney disease  liver disease  an unusual or allergic reaction to insulin, metacresol, other medicines, foods, dyes, or preservatives  pregnant or trying to get pregnant  breast-feeding How should I use this medicine? This medicine is for injection under the skin. Take this medicine at the same time(s) each day. Use exactly as directed. This insulin should never be mixed in the same syringe with other insulins before injection. Do not vigorously shake insulin before use. You will be taught how to adjust doses for activities and illness. Do not use more insulin than prescribed. Do not use more or less often than prescribed. Always check the appearance of your insulin before using it. This medicine should be clear and colorless like water. Do not use if it is cloudy, thickened, colored, or has solid particles in it. If you use a pen, be sure to take off the outer needle cover before using the dose. It is important that you put your used needles and syringes in a special sharps container. Do not put them in a trash can. If you do not have a sharps container, call your pharmacist or healthcare provider to get one. This drug comes with INSTRUCTIONS FOR USE. Ask your pharmacist for directions on how  to use this drug. Read the information carefully. Talk to your pharmacist or health care provider if you have questions. Talk to your pediatrician regarding the use of this medicine in children. While this drug may be prescribed for children as young as 2 years for selected conditions, precautions do apply. Overdosage: If you think you have taken too much of this medicine contact a poison control center or emergency room at once. NOTE: This medicine is only for you. Do not share this medicine with others. What if I miss a dose? It is important not to miss a dose. Your health care professional or doctor should discuss a plan for missed doses with you. If you do miss a dose, follow their plan. Do not take double doses. What may interact with this medicine?  other medicines for diabetes Many medications may cause changes in blood sugar, these include:  alcohol containing beverages  antiviral medicines  for HIV or AIDS  aspirin and aspirin-like drugs  certain medicines for blood pressure, heart disease, irregular heart beat  chromium  diuretics  male hormones, such as estrogens or progestins, birth control pills  fenofibrate  gemfibrozil  isoniazid  lanreotide  male hormones or anabolic steroids  MAOIs like Carbex, Eldepryl, Marplan, Nardil, and Parnate  medicines for weight loss  medicines for allergies, asthma, cold, or cough  medicines for depression, anxiety, or psychotic disturbances  niacin  nicotine  NSAIDs, medicines for pain and inflammation, like ibuprofen or naproxen  octreotide  pasireotide  pentamidine  phenytoin  probenecid  quinolone antibiotics such as ciprofloxacin, levofloxacin, ofloxacin  some herbal dietary supplements  steroid medicines such as prednisone or cortisone  sulfamethoxazole; trimethoprim  thyroid hormones Some medications can hide the warning symptoms of low blood sugar (hypoglycemia). You may need to monitor your blood  sugar more closely if you are taking one of these medications. These include:  beta-blockers, often used for high blood pressure or heart problems (examples include atenolol, metoprolol, propranolol)  clonidine  guanethidine  reserpine This list may not describe all possible interactions. Give your health care provider a list of all the medicines, herbs, non-prescription drugs, or dietary supplements you use. Also tell them if you smoke, drink alcohol, or use illegal drugs. Some items may interact with your medicine. What should I watch for while using this medicine? Visit your health care professional or doctor for regular checks on your progress. A test called the HbA1C (A1C) will be monitored. This is a simple blood test. It measures your blood sugar control over the last 2 to 3 months. You will receive this test every 3 to 6 months. Learn how to check your blood sugar. Learn the symptoms of low and high blood sugar and how to manage them. Always carry a quick-source of sugar with you in case you have symptoms of low blood sugar. Examples include hard sugar candy or glucose tablets. Make sure others know that you can choke if you eat or drink when you develop serious symptoms of low blood sugar, such as seizures or unconsciousness. They must get medical help at once. Tell your doctor or health care professional if you have high blood sugar. You might need to change the dose of your medicine. If you are sick or exercising more than usual, you might need to change the dose of your medicine. Do not skip meals. Ask your doctor or health care professional if you should avoid alcohol. Many nonprescription cough and cold products contain sugar or alcohol. These can affect blood sugar. Make sure that you have the right kind of syringe for the type of insulin you use. Try not to change the brand and type of insulin or syringe unless your health care professional or doctor tells you to. Switching insulin  brand or type can cause dangerously high or low blood sugar. Always keep an extra supply of insulin, syringes, and needles on hand. Use a syringe one time only. Throw away syringe and needle in a closed container to prevent accidental needle sticks. Insulin pens and cartridges should never be shared. Even if the needle is changed, sharing may result in passing of viruses like hepatitis or HIV. Each time you get a new box of pen needles, check to see if they are the same type as the ones you were trained to use. If not, ask your health care professional to show you how to use this new type properly. Wear  a medical ID bracelet or chain, and carry a card that describes your disease and details of your medicine and dosage times. What side effects may I notice from receiving this medicine? Side effects that you should report to your doctor or health care professional as soon as possible:  allergic reactions like skin rash, itching or hives, swelling of the face, lips, or tongue  breathing problems  signs and symptoms of high blood sugar such as dizziness, dry mouth, dry skin, fruity breath, nausea, stomach pain, increased hunger or thirst, increased urination  signs and symptoms of low blood sugar such as feeling anxious, confusion, dizziness, increased hunger, unusually weak or tired, sweating, shakiness, cold, irritable, headache, blurred vision, fast heartbeat, loss of consciousness Side effects that usually do not require medical attention (report to your doctor or health care professional if they continue or are bothersome):  increase or decrease in fatty tissue under the skin due to overuse of a particular injection site  itching, burning, swelling, or rash at site where injected This list may not describe all possible side effects. Call your doctor for medical advice about side effects. You may report side effects to FDA at 1-800-FDA-1088. Where should I keep my medicine? Keep out of the reach  of children. Unopened Vials: Levemir vials: Store in a refrigerator between 2 and 8 degrees C (36 and 46 degrees F) or at room temperature below 30 degrees C (86 degrees F). Do not freeze or use if the insulin has been frozen. Protect from light and excessive heat. If stored at room temperature, the vial must be discarded after 42 days. Throw away any unopened and unused medicine that has been stored in the refrigerator after the expiration date. Unopened Pens: Levemir Flextouch pens: Store in a refrigerator between 2 and 8 degrees C (36 and 46 degrees F) or at room temperature below 30 degrees C (86 degrees F). Do not freeze or use if the insulin has been frozen. Protect from light and excessive heat. If stored at room temperature, the pen must be discarded after 42 days. Throw away any unopened and unused medicine that has been stored in the refrigerator after the expiration date. Vials that you are using: Levemir vials: Store in the refrigerator or at room temperature below 30 degrees C (86 degrees F). Do not freeze. Keep away from heat and light. Throw the opened vial away after 42 days. Pens that you are using: Levemir Flextouch pens: Store at room temperature, below 30 degrees C (86 degrees F). Do not store in the refrigerator once opened. Do not refrigerate or freeze. Keep away from heat and light. Throw the pen away after 42 days, even if it still has insulin left in it. NOTE: This sheet is a summary. It may not cover all possible information. If you have questions about this medicine, talk to your doctor, pharmacist, or health care provider.  2020 Elsevier/Gold Standard (2018-12-27 08:10:07) Insulin Aspart injection What is this medicine? INSULIN ASPART (IN su lin AS part) is a human-made form of insulin. This drug lowers the amount of sugar in your blood. It is a fast acting insulin that starts working faster than regular insulin. It will not work as long as regular insulin. This medicine  may be used for other purposes; ask your health care provider or pharmacist if you have questions. COMMON BRAND NAME(S): Fiasp, [employer], [employer], NovoLog, NovoLog Flexpen, NovoLog PenFill What should I tell my health care provider before I take  this medicine? They need to know if you have any of these conditions:  episodes of low blood sugar  eye disease, vision problems  kidney disease  liver disease  an unusual or allergic reaction to insulin, metacresol, other medicines, foods, dyes, or preservatives  pregnant or trying to get pregnant  breast-feeding How should I use this medicine? This medicine is for injection under the skin. Use exactly as directed. It is important to follow the directions given to you by your health care professional or doctor. If you are using Novolog, you should start your meal within 5 to 10 minutes after injection. If you are using Fiasp, you should start your meal at the time of injection or within 20 minutes after injection. Have food ready before injection. Do not delay eating. You will be taught how to use this medicine and how to adjust doses for activities and illness. Do not use more insulin than prescribed. Do not use more or less often than prescribed. Always check the appearance of your insulin before using it. This medicine should be clear and colorless like water. Do not use if it is cloudy, thickened, colored, or has solid particles in it. If you use a pen, be sure to take off the outer needle cover before using the dose. It is important that you put your used needles and syringes in a special sharps container. Do not put them in a trash can. If you do not have a sharps container, call your pharmacist or healthcare provider to get one. This drug comes with INSTRUCTIONS FOR USE. Ask your pharmacist for directions on how to use this drug. Read the information carefully. Talk to your pharmacist or health care provider if you have  questions. Talk to your pediatrician regarding the use of this medicine in children. While this drug may be prescribed for children as young as 2 years for selected conditions, precautions do apply. Overdosage: If you think you have taken too much of this medicine contact a poison control center or emergency room at once. NOTE: This medicine is only for you. Do not share this medicine with others. What if I miss a dose? It is important not to miss a dose. Your health care professional or doctor should discuss a plan for missed doses with you. If you do miss a dose, follow their plan. Do not take double doses. What may interact with this medicine?  other medicines for diabetes Many medications may cause an increase or decrease in blood sugar, these include:  alcohol containing beverages  antiviral medicines for HIV or AIDS  aspirin and aspirin-like drugs  certain medicines for depression, anxiety, or psychotic disturbances  chromium  diuretics  male hormones, like estrogens or progestins and birth control pills  heart medicines  isoniazid  MAOIs like Carbex, Eldepryl, Marplan, Nardil, and Parnate  male hormones or anabolic steroids  medicines for weight loss  medicines for allergies, asthma, cold, or cough  niacin  NSAIDs, medicines for pain and inflammation, like ibuprofen or naproxen  octreotide  pentamidine  phenytoin  probenecid  quinolone antibiotics like ciprofloxacin, levofloxacin, ofloxacin  some herbal dietary supplements  steroid medicines like prednisone or cortisone  sulfamethoxazole; trimethoprim  thyroid medicine Some medications can hide the warning symptoms of low blood sugar. You may need to monitor your blood sugar more closely if you are taking one of these medications. These include:  beta-blockers such as atenolol, metoprolol, propranolol  clonidine  guanethidine  reserpine This list may not describe  all possible interactions.  Give your health care provider a list of all the medicines, herbs, non-prescription drugs, or dietary supplements you use. Also tell them if you smoke, drink alcohol, or use illegal drugs. Some items may interact with your medicine. What should I watch for while using this medicine? Visit your health care professional or doctor for regular checks on your progress. A test called the HbA1C (A1C) will be monitored. This is a simple blood test. It measures your blood sugar control over the last 2 to 3 months. You will receive this test every 3 to 6 months. Learn how to check your blood sugar. Learn the symptoms of low and high blood sugar and how to manage them. Always carry a quick-source of sugar with you in case you have symptoms of low blood sugar. Examples include hard sugar candy or glucose tablets. Make sure others know that you can choke if you eat or drink when you develop serious symptoms of low blood sugar, such as seizures or unconsciousness. They must get medical help at once. Tell your doctor or health care professional if you have high blood sugar. You might need to change the dose of your medicine. If you are sick or exercising more than usual, you might need to change the dose of your medicine. Do not skip meals. Ask your doctor or health care professional if you should avoid alcohol. Many nonprescription cough and cold products contain sugar or alcohol. These can affect blood sugar. Make sure that you have the right kind of syringe for the type of insulin you use. Try not to change the brand and type of insulin or syringe unless your health care professional or doctor tells you to. Switching insulin brand or type can cause dangerously high or low blood sugar. Always keep an extra supply of insulin, syringes, and needles on hand. Use a syringe one time only. Throw away syringe and needle in a closed container to prevent accidental needle sticks. Insulin pens and cartridges should never be  shared. Even if the needle is changed, sharing may result in passing of viruses like hepatitis or HIV. Each time you get a new box of pen needles, check to see if they are the same type as the ones you were trained to use. If not, ask your health care professional to show you how to use this new type properly. Wear a medical ID bracelet or chain, and carry a card that describes your disease and details of your medicine and dosage times. What side effects may I notice from receiving this medicine? Side effects that you should report to your doctor or health care professional as soon as possible:  allergic reactions like skin rash, itching or hives, swelling of the face, lips, or tongue  breathing problems  signs and symptoms of high blood sugar such as dizziness, dry mouth, dry skin, fruity breath, nausea, stomach pain, increased hunger or thirst, increased urination  signs and symptoms of low blood sugar such as feeling anxious, confusion, dizziness, increased hunger, unusually weak or tired, sweating, shakiness, cold, irritable, headache, blurred vision, fast heartbeat, loss of consciousness Side effects that usually do not require medical attention (report to your doctor or health care professional if they continue or are bothersome):  increase or decrease in fatty tissue under the skin due to overuse of a particular injection site  itching, burning, swelling, or rash at site where injected This list may not describe all possible side effects. Call your doctor for  medical advice about side effects. You may report side effects to FDA at 1-800-FDA-1088. Where should I keep my medicine? Keep out of the reach of children. Unopened Vials: Novolog Vials: Store in a refrigerator between 2 and 8 degrees C (36 and 46 degrees F) or at room temperature below 30 degrees C (86 degrees F). Do not freeze or use if the insulin has been frozen. Protect from light and excessive heat. If stored at room  temperature, the vial must be discarded after 28 days. Throw away any unopened and unused medicine that has been stored in the refrigerator after the expiration date. Fiasp Vials: Store in a refrigerator between 2 and 8 degrees C (36 and 46 degrees F) or at room temperature below 30 degrees C (86 degrees F). Do not freeze or use if the insulin has been frozen. Protect from light and excessive heat. If stored at room temperature, the vial must be discarded after 28 days. Throw away any unopened and unused medicine that has been stored in the refrigerator after the expiration date. Unopened Pens and Cartridges: Novolog Flexpens and cartridges: Store in a refrigerator between 2 and 8 degrees C (36 and 46 degrees F) or at room temperature below 30 degrees C (86 degrees F). Do not freeze or use if the insulin has been frozen. Protect from light and excessive heat. If stored at room temperature, the pen or cartridge must be discarded after 28 days. Throw away any unopened and unused medicine that has been stored in the refrigerator after the expiration date. Fiasp FlexTouch pens: Store in a refrigerator between 2 and 8 degrees C (36 and 46 degrees F) or at room temperature below 30 degrees C (86 degrees F). Do not freeze or use if the insulin has been frozen. Protect from light and excessive heat. If stored at room temperature, the pen must be discarded after 28 days. Throw away any unopened and unused medicine that has been stored in the refrigerator after the expiration date. Fiasp FlexTouch cartridges: Store at room temperature below 30 degrees C (86 degrees F). Do not refrigerate or freeze. Keep away from heat and light. Throw the cartridge away after 28 days, even if it still has insulin left in it. Vials that you are using: Novolog Vials: Store in the refrigerator or at room temperature below 30 degrees C (86 degrees F). Do not freeze. Keep away from heat and light. Throw the opened vial away after 28  days. Fiasp Vials: Store in the refrigerator or at room temperature below 30 degrees C (86 degrees F). Do not freeze. Keep away from heat and light. Throw the opened vial away after 28 days. Pens and cartridges that you are using: Novolog Flexpens and cartridges: Store at room temperature below 30 degrees C (86 degrees F). Do not refrigerate or freeze. Keep away from heat and light. Throw away the pen or cartridge after 28 days, even if it still has insulin left in it. Fiasp FlexTouch pens: Store in the refrigerator or at room temperature below 30 degrees C (86 degrees F). Do not freeze. Keep away from heat and light. Throw the pen away after 28 days, even if it still has insulin left in it. Fiasp FlexTouch cartridges: Store at room temperature below 30 degrees C (86 degrees F). Do not refrigerate or freeze. Keep away from heat and light. Throw the cartridge away after 28 days, even if it still has insulin left in it. NOTE: This sheet is a summary. It  may not cover all possible information. If you have questions about this medicine, talk to your doctor, pharmacist, or health care provider.  2020 Elsevier/Gold Standard (2018-12-27 08:02:23)

## 2019-08-21 NOTE — Telephone Encounter (Signed)
We do not dispense the Accu-chek products for cash paying patients.  Can you resend for the True Metrix test strips, lancets, meter?

## 2019-08-22 ENCOUNTER — Telehealth: Payer: Self-pay | Admitting: *Deleted

## 2019-08-22 LAB — COMP. METABOLIC PANEL (12)
AST: 23 IU/L (ref 0–40)
Albumin/Globulin Ratio: 1.3 (ref 1.2–2.2)
Albumin: 3.8 g/dL — ABNORMAL LOW (ref 4.0–5.0)
Alkaline Phosphatase: 90 IU/L (ref 39–117)
BUN/Creatinine Ratio: 15 (ref 9–20)
BUN: 10 mg/dL (ref 6–20)
Bilirubin Total: 0.2 mg/dL (ref 0.0–1.2)
Calcium: 9.3 mg/dL (ref 8.7–10.2)
Chloride: 102 mmol/L (ref 96–106)
Creatinine, Ser: 0.67 mg/dL — ABNORMAL LOW (ref 0.76–1.27)
GFR calc Af Amer: 141 mL/min/{1.73_m2} (ref >59)
GFR calc non Af Amer: 122 mL/min/{1.73_m2} (ref >59)
Globulin, Total: 3 g/dL (ref 1.5–4.5)
Glucose: 294 mg/dL — ABNORMAL HIGH (ref 65–99)
Potassium: 4.3 mmol/L (ref 3.5–5.2)
Sodium: 138 mmol/L (ref 134–144)
Total Protein: 6.8 g/dL (ref 6.0–8.5)

## 2019-08-22 LAB — LIPID PANEL
Chol/HDL Ratio: 3.1 ratio (ref 0.0–5.0)
Cholesterol, Total: 138 mg/dL (ref 100–199)
HDL: 45 mg/dL (ref >39)
LDL Chol Calc (NIH): 79 mg/dL (ref 0–99)
Triglycerides: 72 mg/dL (ref 0–149)
VLDL Cholesterol Cal: 14 mg/dL (ref 5–40)

## 2019-08-22 LAB — CBC WITH DIFFERENTIAL/PLATELET
Basophils Absolute: 0 10*3/uL (ref 0.0–0.2)
Basos: 0 %
EOS (ABSOLUTE): 0.2 10*3/uL (ref 0.0–0.4)
Eos: 2 %
Hematocrit: 47.4 % (ref 37.5–51.0)
Hemoglobin: 15.7 g/dL (ref 13.0–17.7)
Immature Grans (Abs): 0 10*3/uL (ref 0.0–0.1)
Immature Granulocytes: 0 %
Lymphocytes Absolute: 2.2 10*3/uL (ref 0.7–3.1)
Lymphs: 22 %
MCH: 26.1 pg — ABNORMAL LOW (ref 26.6–33.0)
MCHC: 33.1 g/dL (ref 31.5–35.7)
MCV: 79 fL (ref 79–97)
Monocytes Absolute: 0.8 10*3/uL (ref 0.1–0.9)
Monocytes: 7 %
Neutrophils Absolute: 6.8 10*3/uL (ref 1.4–7.0)
Neutrophils: 69 %
Platelets: 217 10*3/uL (ref 150–450)
RBC: 6.01 x10E6/uL — ABNORMAL HIGH (ref 4.14–5.80)
RDW: 14.1 % (ref 11.6–15.4)
WBC: 10.1 10*3/uL (ref 3.4–10.8)

## 2019-08-22 LAB — MICROALBUMIN / CREATININE URINE RATIO
Creatinine, Urine: 248.3 mg/dL
Microalb/Creat Ratio: 7 mg/g creat (ref 0–29)
Microalbumin, Urine: 17.5 ug/mL

## 2019-08-22 LAB — TSH: TSH: 1.73 u[IU]/mL (ref 0.450–4.500)

## 2019-08-22 LAB — VITAMIN D 25 HYDROXY (VIT D DEFICIENCY, FRACTURES): Vit D, 25-Hydroxy: 26.5 ng/mL — ABNORMAL LOW (ref 30.0–100.0)

## 2019-08-22 NOTE — Telephone Encounter (Signed)
-----   Message from Roney Jaffe, [place] sent at 08/22/2019 11:19 AM EDT ----- Please call patient and let him know that his creatine level was decreased, he does need to make sure that he is very well hydrated.  His vitamin D level slightly low, it is very important that he takes vitamin D over-the-counter on a daily basis, to take 2000 international units a day.  Cholesterol and thyroid and liver function were within normal limits  Thank you

## 2019-08-22 NOTE — Telephone Encounter (Signed)
MA unable to reach patient x2 with no V option

## 2019-08-28 ENCOUNTER — Other Ambulatory Visit: Payer: Self-pay | Admitting: Occupational Medicine

## 2019-08-28 ENCOUNTER — Ambulatory Visit: Payer: Self-pay

## 2019-08-28 ENCOUNTER — Other Ambulatory Visit: Payer: Self-pay

## 2019-08-28 DIAGNOSIS — M545 Low back pain, unspecified: Secondary | ICD-10-CM

## 2019-09-19 ENCOUNTER — Ambulatory Visit: Payer: Self-pay | Admitting: Critical Care Medicine

## 2019-12-05 ENCOUNTER — Other Ambulatory Visit: Payer: Self-pay

## 2019-12-05 ENCOUNTER — Emergency Department (HOSPITAL_COMMUNITY)
Admission: EM | Admit: 2019-12-05 | Discharge: 2019-12-05 | Discharge status: Against Medical Advice | Payer: 59 | Attending: Emergency Medicine | Admitting: Emergency Medicine

## 2019-12-05 ENCOUNTER — Encounter (HOSPITAL_COMMUNITY): Payer: Self-pay | Admitting: Emergency Medicine

## 2019-12-05 DIAGNOSIS — E119 Type 2 diabetes mellitus without complications: Secondary | ICD-10-CM | POA: Diagnosis not present

## 2019-12-05 DIAGNOSIS — M549 Dorsalgia, unspecified: Secondary | ICD-10-CM | POA: Diagnosis not present

## 2019-12-05 DIAGNOSIS — Z794 Long term (current) use of insulin: Secondary | ICD-10-CM | POA: Diagnosis not present

## 2019-12-05 DIAGNOSIS — M5431 Sciatica, right side: Secondary | ICD-10-CM | POA: Insufficient documentation

## 2019-12-05 DIAGNOSIS — F17210 Nicotine dependence, cigarettes, uncomplicated: Secondary | ICD-10-CM | POA: Diagnosis not present

## 2019-12-05 MED ORDER — METHYLPREDNISOLONE 4 MG PO TBPK
ORAL_TABLET | ORAL | 0 refills | Status: AC
Start: 2019-12-05 — End: ?

## 2019-12-05 MED ORDER — KETOROLAC TROMETHAMINE 60 MG/2ML IM SOLN: 60.0000 mg | Freq: Once | INTRAMUSCULAR | Status: DC

## 2019-12-05 NOTE — ED Provider Notes (Addendum)
[facility]-EMERGENCY DEPT Provider Note   CSN: [id number] Arrival date & time: 12/05/19  1324     History No chief complaint on file.   Michael Mcgrath is a 39 y.o. male.  HPI 39 year old male with a history of DM type II, sciatica presents to the ER with a flare of his right-sided sciatica.  Patient states that he has been doing a lot of lifting at work, and has noticed that he has been progressively having worsening radiating pain down his right leg.  He has been taking ibuprofen, applying ice, taking BC powder with little relief.  He denies any groin numbness, bowel bladder incontinence, foot drop, unintended weight loss, night sweats, IV drug use.  He has a primary care doctor, but has not followed up with him about this in quite some time.  Patient states he was brought here by his boss has been doing workflow.    Past Medical History:  Diagnosis Date  . Diabetes mellitus without complication [facility])     Patient Active Problem List   Diagnosis Date Noted  . Sciatica, unspecified side 08/21/2019    Past Surgical History:  Procedure Laterality Date  . WISDOM TOOTH EXTRACTION         No family history on file.  Social History   Tobacco Use  . Smoking status: Current Every Day Smoker    Packs/day: 0.50    Years: 8.00    Pack years: 4.00    Types: Cigarettes  . Smokeless tobacco: Never Used  Vaping Use  . Vaping Use: Never used  Substance Use Topics  . Alcohol use: Yes  . Drug use: Never    Home Medications Prior to Admission medications   Medication Sig Start Date End Date Taking? Authorizing Provider  Accu-Chek Softclix Lancets lancets Use as instructed 08/21/19   Mayers, Cari S, PA-C  Blood Glucose Monitoring Suppl (TRUE METRIX METER) DEVI 1 Units by Does not apply route in the morning, at noon, in the evening, and at bedtime. 08/21/19   Mayers, Cari S, PA-C  cyclobenzaprine (FLEXERIL) 10 MG tablet Take 1 tablet (10 mg total) by mouth 3  (three) times daily as needed for muscle spasms. 08/21/19   Mayers, Cari S, PA-C  glucose blood (TRUE METRIX PRO BLOOD GLUCOSE) test strip Use as instructed 08/21/19   Mayers, Cari S, PA-C  ibuprofen (ADVIL) 600 MG tablet Take 1 tablet (600 mg total) by mouth every 8 (eight) hours as needed. 08/21/19   Mayers, Cari S, PA-C  insulin aspart (NOVOLOG) 100 UNIT/ML injection Inject 10 Units into the skin 3 (three) times daily before meals. 08/21/19 11/19/19  Mayers, Cari S, PA-C  insulin detemir (LEVEMIR) 100 UNIT/ML injection Inject 0.1 mLs (10 Units total) into the skin at bedtime. 08/21/19 11/19/19  Mayers, Cari S, PA-C  insulin lispro (HUMALOG) 100 UNIT/ML KwikPen Inject 0.1 mLs (10 Units total) into the skin 3 (three) times daily. 08/21/19 11/19/19  Mayers, Cari S, PA-C  Insulin Pen Needle (TRUEPLUS 5-BEVEL PEN NEEDLES) 32G X 4 MM MISC use 4 times a day 08/21/19   Mayers, Cari S, PA-C  Insulin Syringe-Needle U-100 (INSULIN SYRINGE .5CC/30GX1/2") 30G X 1/2" 0.5 ML MISC 1 Syringe by Does not apply route in the morning, at noon, and at bedtime. 08/21/19   Mayers, Cari S, PA-C  lisinopril (ZESTRIL) 5 MG tablet Take 1 tablet (5 mg total) by mouth daily. 08/21/19   Mayers, Cari S, PA-C  methylPREDNISolone (MEDROL DOSEPAK) 4 MG TBPK tablet  Please take as directed until finished 12/05/19   Mare Ferrari, PA-C    Allergies    Hydrocodone  Review of Systems   Review of Systems  Constitutional: Negative for chills and fever.  Musculoskeletal: Positive for back pain.  Neurological: Negative for weakness and numbness.    Physical Exam Updated Vital Signs BP (!) 142/79 (BP Location: Left Arm)   Pulse 74   Temp 98.2 F (36.8 C) (Oral)   Resp 18   Ht 5\' 6"  (1.676 m)   Wt 105.7 kg   SpO2 95%   BMI 37.61 kg/m   Physical Exam Vitals reviewed.  Constitutional:      General: He is not in acute distress.    Appearance: Normal appearance. He is not ill-appearing, toxic-appearing or diaphoretic.  HENT:      Head: Normocephalic and atraumatic.  Eyes:     General:        Right eye: No discharge.        Left eye: No discharge.     Extraocular Movements: Extraocular movements intact.     Conjunctiva/sclera: Conjunctivae normal.  Cardiovascular:     Rate and Rhythm: Normal rate and regular rhythm.     Pulses: Normal pulses.     Heart sounds: Normal heart sounds.  Pulmonary:     Effort: Pulmonary effort is normal.     Breath sounds: Normal breath sounds.  Musculoskeletal:        General: No swelling. Normal range of motion.     Cervical back: Normal range of motion.     Comments: No C, T, L-spine tenderness.  5/5 strength in upper and lower extremities.  No noticeable step-offs, crepitus, fluctuance, erythema.  Sensations intact.  Full range of motion and strength of neck. Right leg ROM slightly decreased 2/2 to pain. Full ROM of left left. TTP to  SI Joint on the right. Pt ambulated with slight pain in the ED     Skin:    General: Skin is warm and dry.     Capillary Refill: Capillary refill takes less than 2 seconds.  Neurological:     General: No focal deficit present.     Mental Status: He is alert and oriented to person, place, and time.  Psychiatric:        Mood and Affect: Mood normal.        Behavior: Behavior normal.     ED Results / Procedures / Treatments   Labs (all labs ordered are listed, but only abnormal results are displayed) Labs Reviewed - No data to display  EKG None  Radiology No results found.  Procedures Procedures (including critical care time)  Medications Ordered in ED Medications  ketorolac (TORADOL) injection 60 mg (has no administration in time range)    ED Course  I have reviewed the triage vital signs and the nursing notes.  Pertinent labs & imaging results that were available during my care of the patient were reviewed by me and considered in my medical decision making (see chart for details).    MDM Rules/Calculators/A&P                           Patient with back pain.  Last plain films of LSPINE done in May of 2021 with mild degenerative changes.  No neurological deficits and normal neuro exam.  Patient can walk but states is painful.  No loss of bowel or bladder control.  No concern for  cauda equina.  No fever, night sweats, weight loss, h/o cancer, IVDU.  Plan was for Toradol, send home with medrol dose pack and stretches. However nursing staff informed me shortly after I saw the patient, he left.  Pt without red flag signs, stable for discharge.    Final Clinical Impression(s) / ED Diagnoses Final diagnoses:  Sciatica of right side    Rx / DC Orders ED Discharge Orders         Ordered    methylPREDNISolone (MEDROL DOSEPAK) 4 MG TBPK tablet     Discontinue  Reprint     12/05/19 1538               Mare Ferrari, PA-C 12/05/19 1549    Tilden Fossa, MD 12/05/19 2344

## 2019-12-05 NOTE — ED Triage Notes (Signed)
C/C R back and leg pain, pt states he thinks it is his sciatica. Able to ambulate with pain.

## 2019-12-05 NOTE — ED Notes (Signed)
Patient came to the nursing station and slammed his stickers/blood pressure cuff onto nursing station and stated he was leaving.  [name], [place] made aware.

## 2020-09-15 IMAGING — DX DG LUMBAR SPINE COMPLETE 4+V
5 series · 5 of 5 positions shown · non-contrast
Comparison: None.

CLINICAL DATA: Low back pain

EXAM:
LUMBAR SPINE - COMPLETE 4+ VIEW

[l-spine ap]
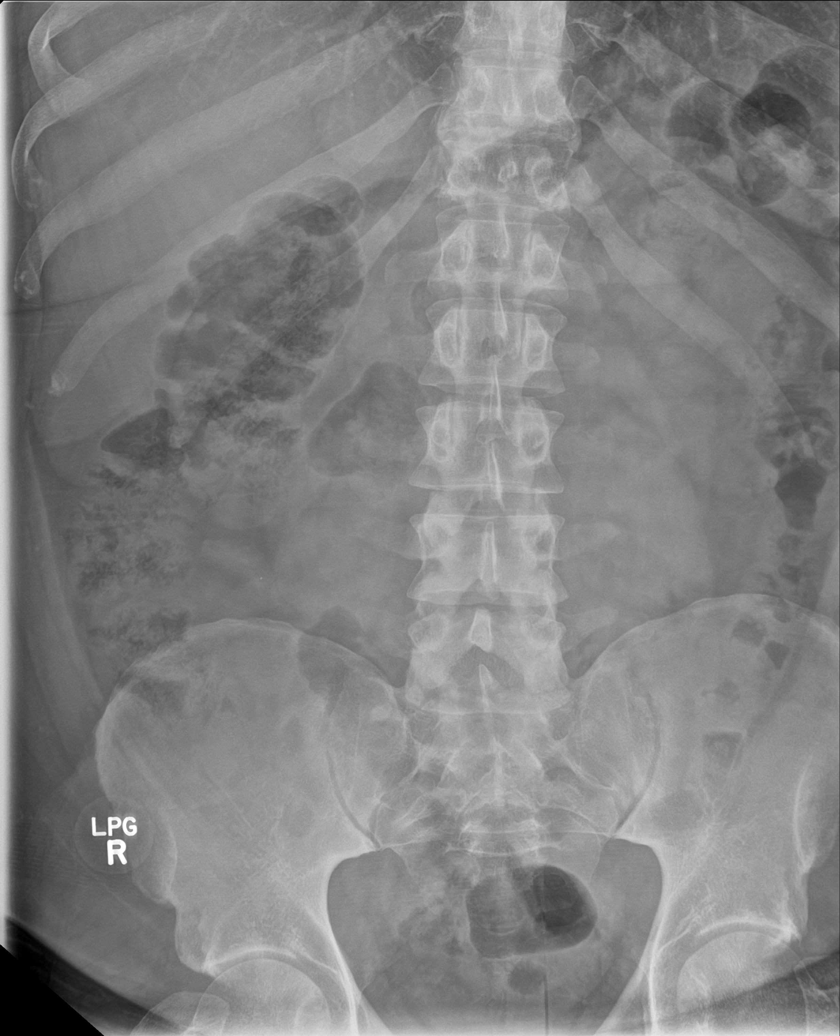

[l-spine obl (1 of 2)]
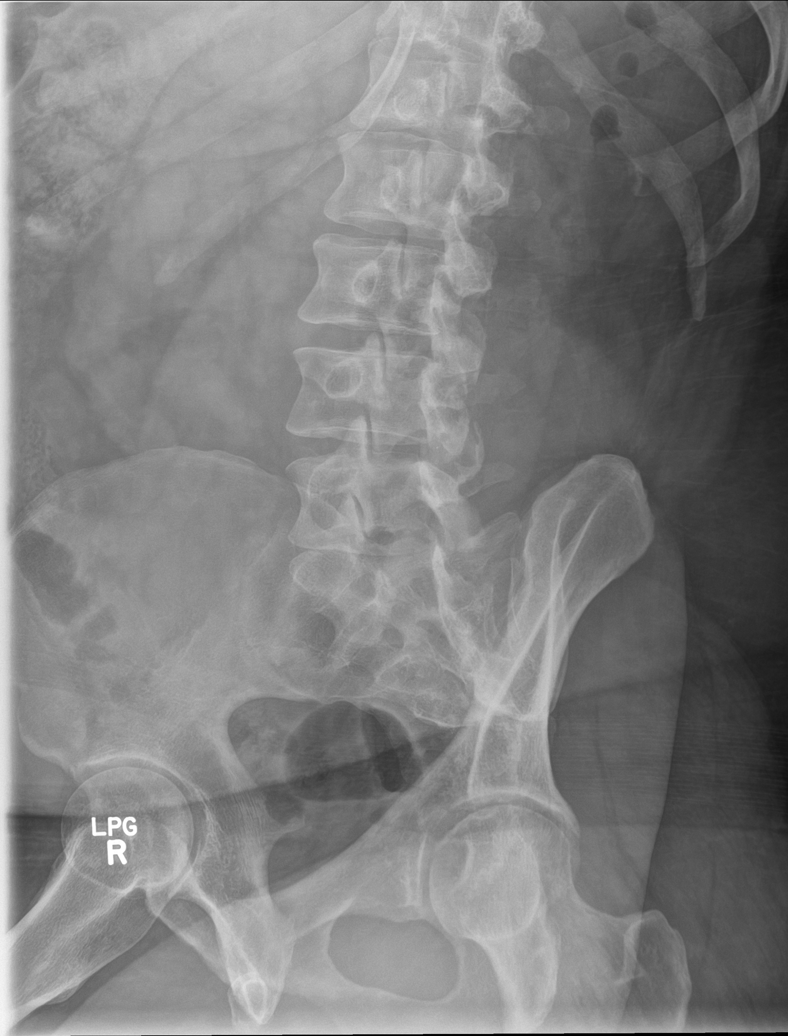

[l-spine obl (2 of 2)]
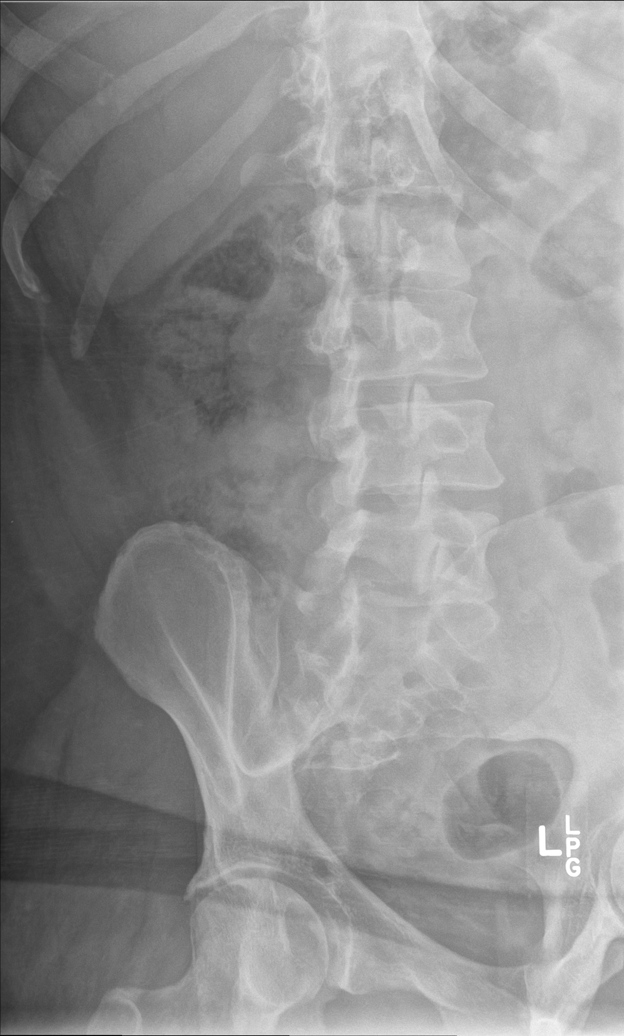

[l-spine lat]
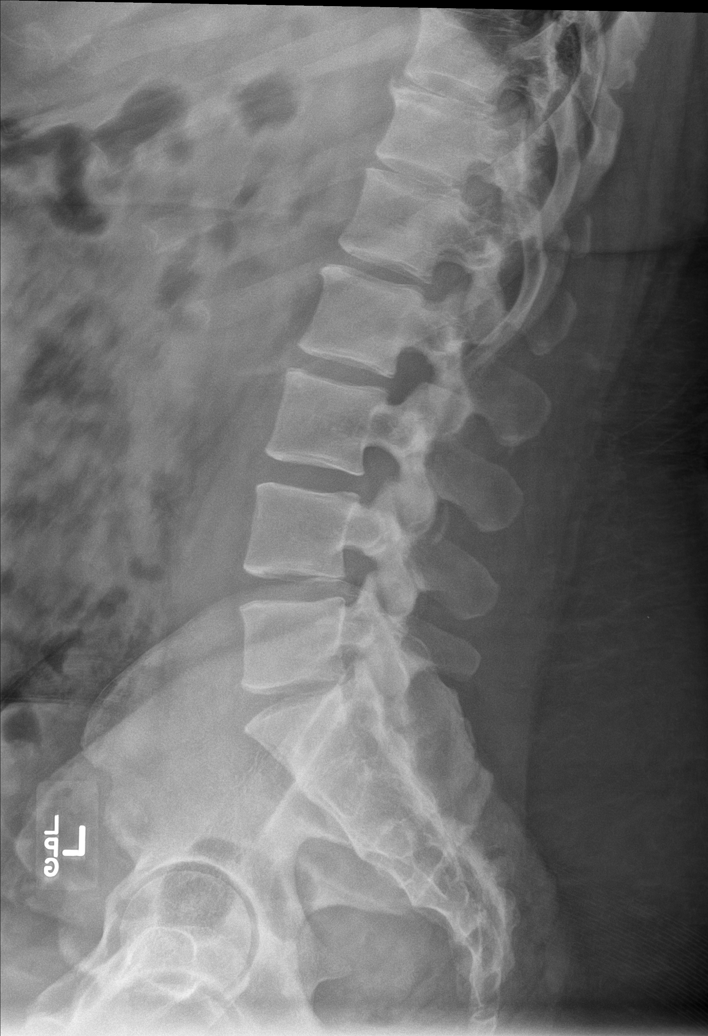

[l-spine spot]
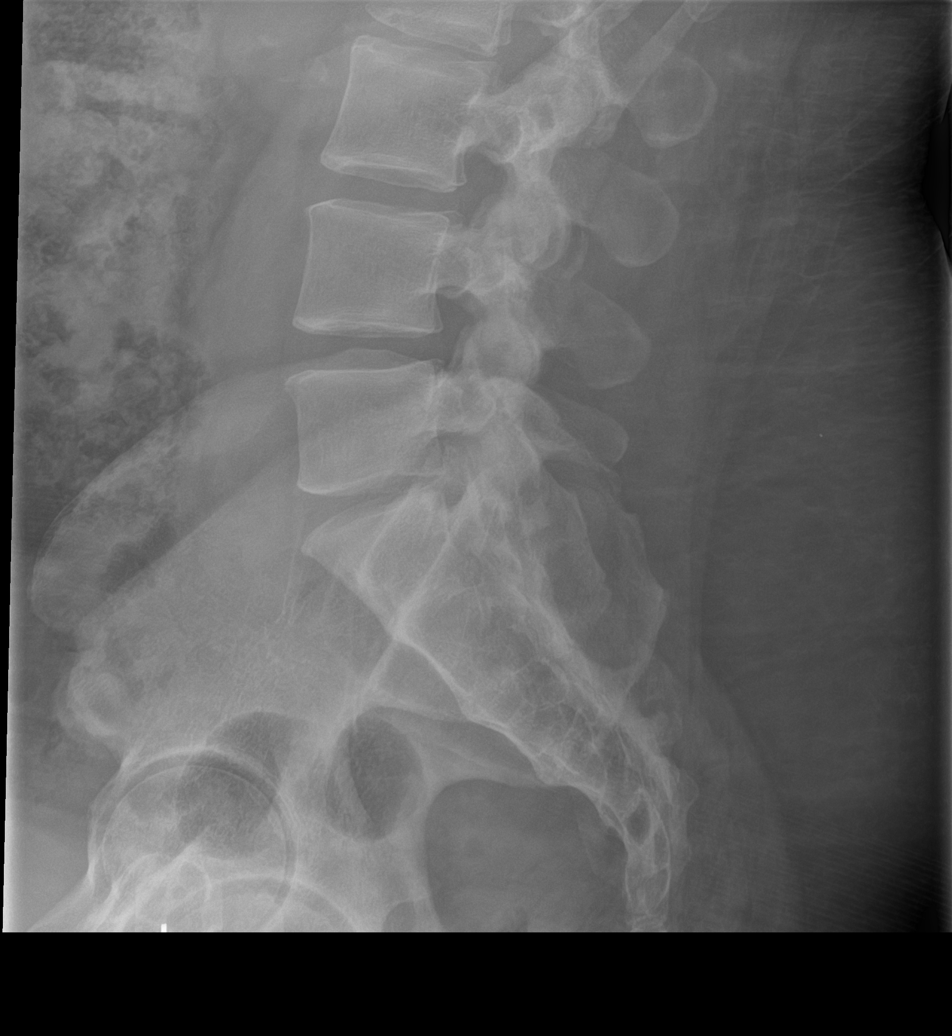

[5 of 5 positions shown; findings below may reference images not displayed]

FINDINGS: There is no evidence of lumbar spine fracture. Alignment is normal.
Mild disc space narrowing at L5-S1.
IMPRESSION: Mild degenerative changes at L5-S1.
